# Patient Record
Sex: Male | Born: 1970 | Race: White | Hispanic: No | Marital: Single | Smoking: Former smoker
Health system: Southern US, Community
[De-identification: ages and names within clinical notes are randomized; demographics above are authoritative.]

## PROBLEM LIST (undated history)

## (undated) DIAGNOSIS — K5090 Crohn's disease, unspecified, without complications: Secondary | ICD-10-CM

## (undated) DIAGNOSIS — C801 Malignant (primary) neoplasm, unspecified: Secondary | ICD-10-CM

## (undated) HISTORY — PX: WISDOM TOOTH EXTRACTION: SHX21

## (undated) HISTORY — PX: COLONOSCOPY: SHX174

## (undated) HISTORY — DX: Malignant (primary) neoplasm, unspecified: C80.1

---

## 2001-05-07 ENCOUNTER — Ambulatory Visit (HOSPITAL_COMMUNITY): Admission: RE | Admit: 2001-05-07 | Discharge: 2001-05-07 | Payer: Self-pay | Admitting: Gastroenterology

## 2001-09-17 ENCOUNTER — Ambulatory Visit (HOSPITAL_COMMUNITY): Admission: RE | Admit: 2001-09-17 | Discharge: 2001-09-17 | Payer: Self-pay | Admitting: Gastroenterology

## 2001-09-17 ENCOUNTER — Encounter: Payer: Self-pay | Admitting: Gastroenterology

## 2010-12-12 ENCOUNTER — Encounter: Payer: Self-pay | Admitting: Gastroenterology

## 2012-01-26 HISTORY — PX: SHOULDER SURGERY: SHX246

## 2012-07-02 ENCOUNTER — Ambulatory Visit (INDEPENDENT_AMBULATORY_CARE_PROVIDER_SITE_OTHER): Payer: 59 | Admitting: Surgery

## 2012-07-02 ENCOUNTER — Encounter (INDEPENDENT_AMBULATORY_CARE_PROVIDER_SITE_OTHER): Payer: Self-pay | Admitting: Surgery

## 2012-07-02 VITALS — BP 120/82 | HR 82 | Temp 98.7°F | Ht 70.0 in | Wt 192.2 lb

## 2012-07-02 DIAGNOSIS — K644 Residual hemorrhoidal skin tags: Secondary | ICD-10-CM

## 2012-07-02 DIAGNOSIS — L0591 Pilonidal cyst without abscess: Secondary | ICD-10-CM

## 2012-07-02 DIAGNOSIS — L0592 Pilonidal sinus without abscess: Secondary | ICD-10-CM

## 2012-07-02 MED ORDER — BUPIVACAINE LIPOSOME 1.3 % IJ SUSP: 20.0000 mL | INTRAMUSCULAR | Status: AC

## 2012-07-02 NOTE — Patient Instructions (Addendum)
Pilonidal Cyst A pilonidal cyst occurs when hairs get trapped (ingrown) beneath the skin in the crease between the buttocks over your sacrum (the bone under that crease). Pilonidal cysts are most common in young men with a lot of body hair. When the cyst is ruptured (breaks) or leaking, fluid from the cyst may cause burning and itching. If the cyst becomes infected, it causes a painful swelling filled with pus (abscess). The pus and trapped hairs need to be removed (often by lancing) so that the infection can heal. However, recurrence is common and an operation may be needed to remove the cyst. HOME CARE INSTRUCTIONS   If the cyst was NOT INFECTED:   Keep the area clean and dry. Bathe or shower daily. Wash the area well with a germ-killing soap. Warm tub baths may help prevent infection and help with drainage. Dry the area well with a towel.   Avoid tight clothing to keep area as moisture free as possible.   Keep area between buttocks as free of hair as possible. A depilatory may be used.   If the cyst WAS INFECTED and needed to be drained:   Your caregiver packed the wound with gauze to keep the wound open. This allows the wound to heal from the inside outwards and continue draining.   Return for a wound check in 1 day or as suggested.   If you take tub baths or showers, repack the wound with gauze following them. Sponge baths (at the sink) are a good alternative.   If an antibiotic was ordered to fight the infection, take as directed.   Only take over-the-counter or prescription medicines for pain, discomfort, or fever as directed by your caregiver.   After the drain is removed, use sitz baths for 20 minutes 4 times per day. Clean the wound gently with mild unscented soap, pat dry, and then apply a dry dressing.  SEEK MEDICAL CARE IF:   You have increased pain, swelling, redness, drainage, or bleeding from the area.   You have a fever.   You have muscles aches, dizziness, or a  general ill feeling.  Document Released: 11/04/2000 Document Revised: 10/27/2011 Document Reviewed: 01/02/2009 [facility] Patient Information 2012 [place], [place].  HEMORRHOIDS   The rectum is the last few inches of your colon, and it naturally stretches to hold stool.  Hemorrhoidal piles are natural clusters of blood vessels that help the rectum stretch to hold stool and allow bowel movements to eliminate feces.  Hemorrhoids are abnormally swollen blood vessels in the rectum.  Too much pressure in the rectum causes hemorrhoids by forcing blood to stretch and bulge the walls of the veins, sometimes even rupturing them.  Hemorrhoids can become like varicose veins you might see on a person's legs. When bulging hemorrhoidal veins are irritated, they can swell, burn, itch, become very painful, and bleed. Once the rectal veins have been stretched out and hemorrhoids created, they are difficult to get rid of completely and tend to recur with less straining than it took to cause them in the first place. Fortunately, good habits and simple medical treatment usually control hemorrhoids well, and surgery is only recommended in unusually severe cases. Some of the most frequent causes of hemorrhoids:    Constant sitting    Straining with bowel movements (from constipation or hard stools)    Diarrhea    Sitting on the toilet for a long time    Severe coughing    Childbirth    Heavy Lifting  Types of Hemorrhoids:    Internal hemorrhoids usually don't hurt or itch; they are deep inside the rectum and usually have no sensation. However, internal hemorrhoids can bleed.  Such bleeding should not be ignored and mask blood from a dangerous source like colorectal cancer, so persistent rectal bleeding should be investigated with a colonoscopy.    External hemorrhoids cause most of the symptoms - pain, burning, and itching. Unirritated hemorrhoids can look like small skin tags coming out of the anus.     Thrombosed  hemorrhoids can form when a hemorrhoid blood vessel bursts and causes the hemorrhoid to swell.  A purple blood clot can form in it and become an excruciatingly painful lump at the anus. Because of these unpleasant symptoms, immediate incision and drainage by a surgeon at an office visit can provide much relief of the pain.    PREVENTION Avoiding the causes listed in above will prevent most cases of hemorrhoids, but this advice is sometimes hard to follow:  How can you avoid sitting all day if you have a seated job? Also, we try to avoid coughing and diarrhea, but sometimes it's beyond your control.  Still, there are some practical hints to help:    If your main job activity is seated, always stand or walk during your breaks. Make it a point to stand and walk at least 5 minutes every hour and try to shift frequently in your chair to avoid direct rectal pressure.    Always exhale as you strain or lift. Don't hold your breath.    Treat coughing, diarrhea and constipation early since irritated hemorrhoids may soon follow.    Do not delay or try to prevent a bowel movement when the urge is present.   Exercise regularly (walking or jogging 60 minutes a day) to stimulate the bowels to move.   Avoid dry toilet paper when cleaning after bowel movements.  Moistened tissues such as baby wipes are less irritating.  Lightly pat the rectal area dry.  Using irrigating showers or bottle irrigation washing can more gently clean this sensitive area.   Keep the anal and genital area clean and  dry.  Talcum or baby powders can help   GET YOUR STOOLS SOFT.   This is the most important way to prevent irritated hemorrhoids.  Hard stools are like sandpaper to the anorectal canal and will cause more problems.   The goal: ONE SOFT BOWEL MOVEMENT A DAY!  To have soft, regular bowel movements:    Drink at least 8 tall glasses of water a day.     AVOID CONSTIPATION    Take plenty of fiber.  Fiber is the undigested part of plant  food that passes into the colon, acting s "natures broom" to encourage bowel motility and movement.  Fiber can absorb and hold large amounts of water. This results in a larger, bulkier stool, which is soft and easier to pass. Work gradually over several weeks up to 6 servings a day of fiber (25g a day even more if needed) in the form of: o Vegetables -- Root (potatoes, carrots, turnips), leafy green (lettuce, salad greens, celery, spinach), or cooked high residue (cabbage, broccoli, etc) o Fruit -- Fresh (unpeeled skin & pulp), Dried (prunes, apricots, cherries, etc ),  or stewed ( applesauce)  o Whole grain breads, pasta, etc (whole wheat)  o Bran cereals    Bulking Agents -- This type of water-retaining fiber generally is easily obtained each day by one of the  following:  o Psyllium bran -- The psyllium plant is remarkable because its ground seeds can retain so much water. This product is available as Metamucil, Konsyl, Effersyllium, Per Diem Fiber, or the less expensive generic preparation in drug and health food stores. Although labeled a laxative, it really is not a laxative.  o Methylcellulose -- This is another fiber derived from wood which also retains water. It is available as Citrucel. o Polyethylene Glycol - and "artificial" fiber commonly called Miralax or Glycolax.  It is helpful for people with gassy or bloated feelings with regular fiber o Flax Seed - a less gassy fiber than psyllium   No reading or other relaxing activity while on the toilet. If bowel movements take longer than 5 minutes, you are too constipated   Laxatives can be useful for a short period if constipation is severe o Osmotics (Milk of Magnesia, Fleets phosphosoda, Magnesium citrate, MiraLax, GoLytely) are safer than  o Stimulants (Senokot, Castor Oil, Dulcolax, Ex Lax)    o Do not take laxatives for more than 7days in a row.   Laxatives are not a good long-term solution as it can stress the intestine and colon and  causes too much mineral and fluid losses.    If badly constipated, try a Bowel Retraining Program: o Do not use laxatives.  o Eat a diet high in roughage, such as bran cereals and leafy vegetables.  o Drink six (6) ounces of prune or apricot juice each morning.  o Eat two (2) large servings of stewed fruit each day.  o Take one (1) heaping dose of a bulking agent (ex. Metamucil, Citrucel, Miralax) twice a day.  o Use sugar-free sweetener when possible to avoid excessive calories.  o Eat a normal breakfast.  o Set aside 15 minutes after breakfast to sit on the toilet, but do not strain to have a bowel movement.  o If you do not have a bowel movement by the third day, use an enema and repeat the above steps.    AVOID DIARRHEA o Switch to liquids and simpler foods for a few days to avoid stressing your intestines further. o Avoid dairy products (especially milk & ice cream) for a short time.  The intestines often can lose the ability to digest lactose when stressed. o Avoid foods that cause gassiness or bloating.  Typical foods include beans and other legumes, cabbage, broccoli, and dairy foods.  Every person has some sensitivity to other foods, so listen to our body and avoid those foods that trigger problems for you. o Adding fiber (Citrucel, Metamucil, psyllium, Miralax) gradually can help thicken stools by absorbing excess fluid and retrain the intestines to act more normally.  Slowly increase the dose over a few weeks.  Too much fiber too soon can backfire and cause cramping & bloating. o Probiotics (such as active yogurt, Align, etc) may help repopulate the intestines and colon with normal bacteria and calm down a sensitive digestive tract.  Most studies show it to be of mild help, though, and such products can be costly. o Medicines:   Bismuth subsalicylate (ex. Kayopectate, Pepto Bismol) every 30 minutes for up to 6 doses can help control diarrhea.  Avoid if pregnant.   Loperamide (Immodium)  can slow down diarrhea.  Start with two tablets (4mg  total) first and then try one tablet every 6 hours.  Avoid if you are having fevers or severe pain.  If you are not better or start feeling worse, stop all medicines  and call your doctor for advice o Call your doctor if you are getting worse or not better.  Sometimes further testing (cultures, endoscopy, X-ray studies, bloodwork, etc) may be needed to help diagnose and treat the cause of the diarrhea.   If these preventive measures fail, you must take action right away! Hemorrhoids are one condition that can be mild in the morning and become intolerable by nightfall.

## 2012-07-02 NOTE — Progress Notes (Signed)
Subjective:     Patient ID: Carl Shields, male   DOB: Sep 09, 1971, 41 y.o.   MRN: [id number]  HPI   Review of Systems  Constitutional: Negative for fever, chills and diaphoresis.  HENT: Negative for nosebleeds, sore throat, facial swelling, mouth sores, trouble swallowing and ear discharge.   Eyes: Negative for photophobia, discharge and visual disturbance.  Respiratory: Negative for choking, chest tightness, shortness of breath and stridor.   Cardiovascular: Negative for chest pain and palpitations.  Gastrointestinal: Positive for constipation and rectal pain. Negative for nausea, vomiting, abdominal pain, diarrhea, blood in stool, abdominal distention and anal bleeding.  Genitourinary: Negative for dysuria, urgency, difficulty urinating and testicular pain.  Musculoskeletal: Negative for myalgias, back pain, arthralgias and gait problem.  Skin: Positive for wound. Negative for color change, pallor and rash.  Neurological: Negative for dizziness, speech difficulty, weakness, numbness and headaches.  Hematological: Negative for adenopathy. Does not bruise/bleed easily.  Psychiatric/Behavioral: Negative for hallucinations, confusion and agitation.       Objective:   Physical Exam  Constitutional: He is oriented to person, place, and time. He appears well-developed and well-nourished. No distress.  HENT:  Head: Normocephalic.  Mouth/Throat: Oropharynx is clear and moist. No oropharyngeal exudate.  Eyes: Conjunctivae and EOM are normal. Pupils are equal, round, and reactive to light. No scleral icterus.  Neck: Normal range of motion. Neck supple. No tracheal deviation present.  Cardiovascular: Normal rate, regular rhythm and intact distal pulses.   Pulmonary/Chest: Effort normal and breath sounds normal. No respiratory distress.  Abdominal: Soft. He exhibits no distension. There is no tenderness. Hernia confirmed negative in the right inguinal area and confirmed negative in the left  inguinal area.  Genitourinary: No penile tenderness.     Musculoskeletal: Normal range of motion. He exhibits no tenderness.       Back:  Lymphadenopathy:    He has no cervical adenopathy.       Right: No inguinal adenopathy present.       Left: No inguinal adenopathy present.  Neurological: He is alert and oriented to person, place, and time. No cranial nerve deficit. He exhibits normal muscle tone. Coordination normal.  Skin: Skin is warm and dry. No rash noted. He is not diaphoretic. No erythema. No pallor.  Psychiatric: He has a normal mood and affect. His behavior is normal. Judgment and thought content normal.       Assessment:     Pilonidal disease   Prolapsed hemorrhoid    Plan:     Removal & closure of pilonidal dz over penrose drains:  The anatomy of the intragluteal cleft was discussed. Pathophysiology of pilonidal disease was discussed. The importance of keeping hairs trimmed to avoid recurrence was discussed. Discussion of options such as curretage, excision with closure vs leaving open was discussed. Risks of infection and need for incision and drainage & antibiotics were discussed.  I noted a good likelihood this will help address the problem.    At this point, I think the patient would best served with considering surgery to excise the diseased tissue. I will make an attempt to close but it may need to be left open to allow it to heal with secondary intention and wound packing. Possible recurrences involve muscle flaps or different techniques were discussed as well. I noted that recurrence is higher with poor compliance on care maintenance and hygiene. The patient's questions were answered. The patient agrees to proceed.  Excision of ext hem:  The anatomy & physiology of  the anorectal region was discussed.  The pathophysiology of hemorrhoids and differential diagnosis was discussed.  Natural history risks without surgery was discussed.   I stressed the importance of  a bowel regimen to have daily soft bowel movements to minimize progression of disease.  Interventions such as sclerotherapy & banding were discussed.  The patient's symptoms are not adequately controlled by medicines and other non-operative treatments.  I feel the risks & problems of no surgery outweigh the operative risks; therefore, I recommended surgery to treat the hemorrhoids by ligation, pexy, and possible resection.  Risks such as bleeding, infection, need for further treatment, heart attack, death, and other risks were discussed.   I noted a good likelihood this will help address the problem.  Goals of post-operative recovery were discussed as well.  Possibility that this will not correct all symptoms was explained.  Post-operative pain, bleeding, constipation, and other problems after surgery were discussed.  We will work to minimize complications.   Educational handouts further explaining the pathology, treatment options, and bowel regimen were given as well.  Questions were answered.  The patient expresses understanding & wishes to proceed with surgery.

## 2012-11-06 ENCOUNTER — Other Ambulatory Visit (INDEPENDENT_AMBULATORY_CARE_PROVIDER_SITE_OTHER): Payer: Self-pay | Admitting: Surgery

## 2012-11-06 DIAGNOSIS — L0591 Pilonidal cyst without abscess: Secondary | ICD-10-CM

## 2012-11-06 DIAGNOSIS — K649 Unspecified hemorrhoids: Secondary | ICD-10-CM

## 2012-11-06 HISTORY — PX: PILONIDAL CYST EXCISION: SHX744

## 2012-11-22 ENCOUNTER — Telehealth (INDEPENDENT_AMBULATORY_CARE_PROVIDER_SITE_OTHER): Payer: Self-pay

## 2012-11-22 NOTE — Telephone Encounter (Signed)
The pt called in for his 1st refill postop.  He was on Oxycodone 5 mg but he wants something not as strong. Per protocol I called in Hydrocodone 5/325 one tab po q 4 hrs to 6 hrs prn pain #30 no refills to [facility] on [address] [phone].

## 2012-11-28 ENCOUNTER — Encounter (INDEPENDENT_AMBULATORY_CARE_PROVIDER_SITE_OTHER): Payer: Self-pay | Admitting: Surgery

## 2012-11-28 ENCOUNTER — Ambulatory Visit (INDEPENDENT_AMBULATORY_CARE_PROVIDER_SITE_OTHER): Payer: 59 | Admitting: Surgery

## 2012-11-28 VITALS — BP 112/82 | HR 78 | Temp 97.8°F | Resp 16 | Ht 70.0 in | Wt 198.2 lb

## 2012-11-28 DIAGNOSIS — L0592 Pilonidal sinus without abscess: Secondary | ICD-10-CM

## 2012-11-28 DIAGNOSIS — L0591 Pilonidal cyst without abscess: Secondary | ICD-10-CM

## 2012-11-28 DIAGNOSIS — K644 Residual hemorrhoidal skin tags: Secondary | ICD-10-CM

## 2012-11-28 NOTE — Progress Notes (Signed)
Subjective:     Patient ID: Carl Shields, male   DOB: 1971-07-09, 42 y.o.   MRN: [id number]  HPI  LEVII HAIRFIELD  11/30/1970 [id number]  Patient Care Team: Kandyce Rud, MD as PCP - General (Family Medicine)  This patient is a 42 y.o.male who presents today for surgical evaluation Status post excision of pilonidal disease with closure over wick drains and external hemorrhoidectomies 11/06/2012 FINAL DIAGNOSIS Diagnosis 1. Hemorrhoids, external - FINDINGS CONSISTENT WITH HEMORRHOIDS. 2. Pilonidal cyst/sinus - BENIGN SKIN WITH INFLAMMATION AND BENIGN SQUAMOUS CYST CONSISTENT WITH PILONIDAL CYST.  The patient comes in today feeling well.  Drainage is tapered off.  Soreness gone away.  Did not have much anal pain after the surgery which was happy surprise.  Rare to have any bleeding.  Off all pain medicines.  In good spirits.  Patient Active Problem List  Diagnosis  . 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 External hemorrhoids with prolapse    History reviewed. No pertinent past medical history.  Past Surgical History  Procedure Date  . Shoulder surgery 01/26/12    right  . Pilonidal cyst excision 11/06/12    History   Social History  . Marital Status: Single    Spouse Name: N/A    Number of Children: N/A  . Years of Education: N/A   Occupational History  . Not on file.   Social History Main Topics  . Smoking status: Former Games developer  . Smokeless tobacco: Former Neurosurgeon    Quit date: 07/02/2009  . Alcohol Use: Yes     Comment: 2 drinks per week  . Drug Use: No  . Sexually Active:    Other Topics Concern  . Not on file   Social History Narrative  . No narrative on file    History reviewed. No pertinent family history.  Current Outpatient Prescriptions  Medication Sig Dispense Refill  . acetaminophen (TYLENOL) 325 MG tablet Take 650 mg by mouth every 6 (six) hours as needed.      Marland Kitchen HYDROcodone-acetaminophen (NORCO/VICODIN) 5-325 MG per tablet Take 1 tablet by  mouth every 6 (six) hours as needed.      Marland Kitchen ibuprofen (ADVIL,MOTRIN) 200 MG tablet Take 200 mg by mouth every 6 (six) hours as needed.         Allergies  Allergen Reactions  . Contrast Media (Iodinated Diagnostic Agents) Itching    sneezing    BP 112/82  Pulse 78  Temp 97.8 F (36.6 C) (Temporal)  Resp 16  Ht 5\' 10"  (1.778 m)  Wt 198 lb 3.2 oz (89.903 kg)  BMI 28.44 kg/m2  SpO2 98%  No results found.   Review of Systems  Constitutional: Negative for fever, chills and diaphoresis.  HENT: Negative for sore throat, trouble swallowing and neck pain.   Eyes: Negative for photophobia and visual disturbance.  Respiratory: Negative for choking and shortness of breath.   Cardiovascular: Negative for chest pain and palpitations.  Gastrointestinal: Negative for nausea, vomiting, abdominal pain, diarrhea, constipation, blood in stool, abdominal distention, anal bleeding and rectal pain.  Genitourinary: Negative for dysuria, urgency, difficulty urinating and testicular pain.  Musculoskeletal: Negative for myalgias, arthralgias and gait problem.  Skin: Negative for color change and rash.  Neurological: Negative for dizziness, speech difficulty, weakness and numbness.  Hematological: Negative for adenopathy.  Psychiatric/Behavioral: Negative for hallucinations, confusion and agitation.       Objective:   Physical Exam  Constitutional: He is oriented to person, place, and time. He appears well-developed  and well-nourished. No distress.  HENT:  Head: Normocephalic.  Mouth/Throat: Oropharynx is clear and moist. No oropharyngeal exudate.  Eyes: Conjunctivae normal and EOM are normal. Pupils are equal, round, and reactive to light. No scleral icterus.  Neck: Normal range of motion. No tracheal deviation present.  Cardiovascular: Normal rate, normal heart sounds and intact distal pulses.   Pulmonary/Chest: Effort normal. No respiratory distress.  Abdominal: Soft. He exhibits no  distension. There is no tenderness. Hernia confirmed negative in the right inguinal area and confirmed negative in the left inguinal area.       Incisions clean with normal healing ridges.  No hernias  Musculoskeletal: Normal range of motion. He exhibits no tenderness.       Back:  Neurological: He is alert and oriented to person, place, and time. No cranial nerve deficit. He exhibits normal muscle tone. Coordination normal.  Skin: Skin is warm and dry. No rash noted. He is not diaphoretic.  Psychiatric: He has a normal mood and affect. His behavior is normal.       Assessment:     2.5 weeks status post excision of pilonidal disease and external hemorrhoids.  Recovering well.    Plan:     Increase activity as tolerated to regular activity.  Do not push through pain.  Diet as tolerated. Bowel regimen to avoid problems.  Return to clinic p.r.n.   Instructions discussed.  Followup with primary care physician for other health issues as would normally be done.  Questions answered.  The patient expressed understanding and appreciation  pos

## 2012-11-28 NOTE — Patient Instructions (Addendum)
Pilonidal Cyst  A pilonidal cyst occurs when hairs get trapped (ingrown) beneath the skin in the crease between the buttocks over your sacrum (the bone under that crease). Pilonidal cysts are most common in young men with a lot of body hair. When the cyst is ruptured (breaks) or leaking, fluid from the cyst may cause burning and itching. If the cyst becomes infected, it causes a painful swelling filled with pus (abscess). The pus and trapped hairs need to be removed (often by lancing) so that the infection can heal. However, recurrence is common and an operation may be needed to remove the cyst.  HOME CARE INSTRUCTIONS    If the cyst was NOT INFECTED:   Keep the area clean and dry. Bathe or shower daily. Wash the area well with a germ-killing soap. Warm tub baths may help prevent infection and help with drainage. Dry the area well with a towel.   Avoid tight clothing to keep area as moisture free as possible.   Keep area between buttocks as free of hair as possible. A depilatory may be used.   If the cyst WAS INFECTED and needed to be drained:   Your caregiver packed the wound with gauze to keep the wound open. This allows the wound to heal from the inside outwards and continue draining.   Return for a wound check in 1 day or as suggested.   If you take tub baths or showers, repack the wound with gauze following them. Sponge baths (at the sink) are a good alternative.   If an antibiotic was ordered to fight the infection, take as directed.   Only take over-the-counter or prescription medicines for pain, discomfort, or fever as directed by your caregiver.   After the drain is removed, use sitz baths for 20 minutes 4 times per day. Clean the wound gently with mild unscented soap, pat dry, and then apply a dry dressing.  SEEK MEDICAL CARE IF:    You have increased pain, swelling, redness, drainage, or bleeding from the area.   You have a fever.   You have muscles aches, dizziness, or a general ill  feeling.  Document Released: 11/04/2000 Document Revised: 01/30/2012 Document Reviewed: 01/02/2009  [employer] Patient Information 2013 [employer].

## 2017-03-10 ENCOUNTER — Other Ambulatory Visit: Payer: Self-pay | Admitting: Family Medicine

## 2017-03-10 DIAGNOSIS — R221 Localized swelling, mass and lump, neck: Secondary | ICD-10-CM

## 2017-03-10 DIAGNOSIS — Z87891 Personal history of nicotine dependence: Secondary | ICD-10-CM

## 2017-03-13 ENCOUNTER — Telehealth: Payer: Self-pay

## 2017-03-13 NOTE — Telephone Encounter (Signed)
LMOM for patient that his 13-hour prep was called in to [facility] on [address] [phone]).  He is to take Prednisone 50mg  PO 03/15/17 at 0400, 1000 and 1600.  At 1600 he also is to take Benadryl 50mg  PO.  Brita Romp, RN

## 2017-03-15 ENCOUNTER — Ambulatory Visit
Admission: RE | Admit: 2017-03-15 | Discharge: 2017-03-15 | Disposition: A | Discharge status: Home / Self Care | Payer: 59 | Source: Ambulatory Visit | Attending: Family Medicine | Admitting: Family Medicine

## 2017-03-15 DIAGNOSIS — R221 Localized swelling, mass and lump, neck: Secondary | ICD-10-CM

## 2017-03-15 DIAGNOSIS — Z87891 Personal history of nicotine dependence: Secondary | ICD-10-CM

## 2017-03-15 MED ORDER — IOPAMIDOL (ISOVUE-300) INJECTION 61%
75.0000 mL | Freq: Once | INTRAVENOUS | Status: AC | PRN
  Administered 2017-03-15: 75 mL via INTRAVENOUS

## 2017-04-01 ENCOUNTER — Telehealth: Payer: Self-pay | Admitting: Hematology and Oncology

## 2017-04-01 ENCOUNTER — Encounter: Payer: Self-pay | Admitting: Hematology and Oncology

## 2017-04-01 NOTE — Telephone Encounter (Signed)
Tc to schedule the pt an appt. Unable to reach Mr. Eline. Lft vm w/appt date and time. Appt has been scheduled for the pt to see Dr. Alvy Bimler on 5/23 at 1030am. Letter mailed to the pt and faxed to referring.

## 2017-04-07 ENCOUNTER — Telehealth: Payer: Self-pay | Admitting: *Deleted

## 2017-04-07 NOTE — Telephone Encounter (Addendum)
Oncology Nurse Navigator Documentation  Called [facility], spoke with [facility], requested appt with Dr. Erik Obey ASAP.  He will be contacted and offered appt for nest Monday.    Gayleen Orem, RN, BSN, [place] [facility] Nurse [place] at [place] [phone]

## 2017-04-10 ENCOUNTER — Telehealth: Payer: Self-pay | Admitting: *Deleted

## 2017-04-10 NOTE — Telephone Encounter (Signed)
Oncology Nurse Navigator Documentation  In follow-up to VM received from patient, called him/[facility] indicating this Wed's appt with Dr. Alvy Bimler has been cancelled, will be rescheduled when results of today's bx with Dr. Erik Obey are available.  I encouraged him to call with questions.  Gayleen Orem, RN, BSN, [place] [facility] Nurse [place] at [place] [phone]

## 2017-04-11 ENCOUNTER — Inpatient Hospital Stay (HOSPITAL_COMMUNITY): Admission: RE | Admit: 2017-04-11 | Payer: Self-pay | Source: Ambulatory Visit

## 2017-04-11 ENCOUNTER — Other Ambulatory Visit: Payer: Self-pay | Admitting: Radiation Oncology

## 2017-04-12 ENCOUNTER — Ambulatory Visit: Payer: 59 | Admitting: Hematology and Oncology

## 2017-04-18 ENCOUNTER — Telehealth: Payer: Self-pay | Admitting: *Deleted

## 2017-04-18 NOTE — Telephone Encounter (Signed)
Oncology Nurse Navigator Documentation  Returned Mr. Ricjmond's VMM. He reported result of bx "unfavorable", he will need PET. I explained I had just spoke to Dr. Nadara Mustard MA, Amy.  They are in process of have PET pre-certified, will schedule ASAP.  He voiced understanding a more timely PET can likely be scheduled if he is willing to travel to [facility].  He is agreeable as long as location is in network with his insurance.  I encouraged him to discuss with Dr. Noreene Filbert office when he is called to schedule. He understands I will arrange appt with Dr. Alvy Bimler when PET is scheduled.  Gayleen Orem, RN, BSN, [place] Neck Oncology Nurse [place] at [place] [phone]

## 2017-04-19 ENCOUNTER — Telehealth: Payer: Self-pay | Admitting: *Deleted

## 2017-04-19 ENCOUNTER — Encounter: Payer: Self-pay | Admitting: Hematology and Oncology

## 2017-04-19 ENCOUNTER — Other Ambulatory Visit (HOSPITAL_COMMUNITY): Payer: Self-pay | Admitting: Otolaryngology

## 2017-04-19 DIAGNOSIS — C770 Secondary and unspecified malignant neoplasm of lymph nodes of head, face and neck: Secondary | ICD-10-CM

## 2017-04-19 NOTE — Telephone Encounter (Signed)
Oncology Nurse Navigator Documentation  Patient returned my call.  I confirmed understanding of 6/6 PET, informed him of 6/8 0800 appt to see Dr. Isidore Moos preceded by 0730 NE.  I explained the purpose of a dental evaluation prior to starting RT, indicated he wd be contacted by [facility] to arrange appt.    He voiced understanding of information provided, understands he can contact me with needs/questions.  Gayleen Orem, RN, BSN, [place] Neck Oncology Nurse [place] at [place] [phone]

## 2017-04-25 ENCOUNTER — Encounter: Payer: Self-pay | Admitting: Radiation Oncology

## 2017-04-25 NOTE — Progress Notes (Addendum)
Head and Neck Cancer Location of Tumor / Histology:  Final Cytologic Interpretation  Right level 2 lymph node, fine needle aspiration l(smears, ThinPrep and cell block): Squamous cell carcinoma, metastatic  Patient presented with symptoms of: He noted the Right Neck mass in mid April.   Biopsies of Right level 2 lymph node revealed: metastatic squamous cell carcinoma.   Nutrition Status Yes No Comments  Weight changes? []  [x]    Swallowing concerns? []  [x]    PEG? []  [x]     Referrals Yes No Comments  Social Work? []  [x]    Dentistry? [x]  []  Dr. Enrique Sack 04/27/17  Swallowing therapy? []  [x]    Nutrition? []  [x]    Med/Onc? [x]  []     Safety Issues Yes No Comments  Prior radiation? []  [x]    Pacemaker/ICD? []  [x]    Possible current pregnancy? []  [x]    Is the patient on methotrexate? []  [x]     Tobacco/Marijuana/Snuff/ETOH use: He is a former smoker and former smokeless tobacco user. He tells me that he is not currently drinking alcohol.  Past/Anticipated interventions by otolaryngology, if any: 1/51/76 Dr. Erik Obey performed a Fine needle Aspiration.   Past/Anticipated interventions by medical oncology, if any:  Dr. Alvy Bimler 04/26/17 Tonsil cancer [facility]) The patient has clinical apparent early stage disease. He has excellent performance status. I think the patient may be a surgical candidate upfront. We will his ENT surgeon to ask whether primary tonsillectomy and selective right neck lymph node dissection versus TORS might be an option. If he has early stage disease with negative margins and no evidence of extracapsular nodal extension, he may be able to avoid chemotherapy altogether. I would get my head and neck navigator to call his ENT surgeon's office   Current Complaints / other details:   PET scan 04/26/17 IMPRESSION: 1. Enlarged hypermetabolic RIGHT level II lymph node consistent with metastatic head and neck carcinoma. 2. Asymmetric hypermetabolic activity in the RIGHT  tonsil is favored primary lesion. 3. No additional evidence metastatic adenopathy in the neck. 4. No distant metastatic disease  BP 118/77   Pulse 75   Temp 98.6 F (37 C)   Ht 5\' 10"  (1.778 m)   Wt 184 lb 12.8 oz (83.8 kg)   SpO2 99% Comment: room air  BMI 26.52 kg/m   Wt Readings from Last 3 Encounters:  04/28/17 184 lb 12.8 oz (83.8 kg)  04/26/17 187 lb 8 oz (85 kg)  11/28/12 198 lb 3.2 oz (89.9 kg)

## 2017-04-26 ENCOUNTER — Ambulatory Visit (HOSPITAL_BASED_OUTPATIENT_CLINIC_OR_DEPARTMENT_OTHER): Payer: 59 | Admitting: Hematology and Oncology

## 2017-04-26 ENCOUNTER — Encounter (HOSPITAL_COMMUNITY)
Admission: RE | Admit: 2017-04-26 | Discharge: 2017-04-26 | Disposition: A | Discharge status: Home / Self Care | Payer: 59 | Source: Ambulatory Visit | Attending: Otolaryngology | Admitting: Otolaryngology

## 2017-04-26 ENCOUNTER — Encounter: Payer: Self-pay | Admitting: Hematology and Oncology

## 2017-04-26 ENCOUNTER — Encounter: Payer: Self-pay | Admitting: *Deleted

## 2017-04-26 DIAGNOSIS — C770 Secondary and unspecified malignant neoplasm of lymph nodes of head, face and neck: Secondary | ICD-10-CM

## 2017-04-26 DIAGNOSIS — C099 Malignant neoplasm of tonsil, unspecified: Secondary | ICD-10-CM

## 2017-04-26 DIAGNOSIS — Z87891 Personal history of nicotine dependence: Secondary | ICD-10-CM

## 2017-04-26 DIAGNOSIS — C090 Malignant neoplasm of tonsillar fossa: Secondary | ICD-10-CM | POA: Insufficient documentation

## 2017-04-26 DIAGNOSIS — Z803 Family history of malignant neoplasm of breast: Secondary | ICD-10-CM

## 2017-04-26 DIAGNOSIS — Z808 Family history of malignant neoplasm of other organs or systems: Secondary | ICD-10-CM | POA: Diagnosis not present

## 2017-04-26 DIAGNOSIS — B977 Papillomavirus as the cause of diseases classified elsewhere: Secondary | ICD-10-CM

## 2017-04-26 DIAGNOSIS — Z801 Family history of malignant neoplasm of trachea, bronchus and lung: Secondary | ICD-10-CM

## 2017-04-26 LAB — GLUCOSE, CAPILLARY: Glucose-Capillary: 110 mg/dL — ABNORMAL HIGH (ref 65–99)

## 2017-04-26 MED ORDER — FLUDEOXYGLUCOSE F - 18 (FDG) INJECTION: 9.1000 | Freq: Once | INTRAVENOUS | Status: DC | PRN

## 2017-04-26 NOTE — Assessment & Plan Note (Addendum)
The patient has clinical apparent early stage disease. He has excellent performance status. I think the patient may be a surgical candidate upfront. We will his ENT surgeon to ask whether primary tonsillectomy and selective right neck lymph node dissection versus TORS might be an option. If he has early stage disease with negative margins and no evidence of extracapsular nodal extension, he may be able to avoid chemotherapy altogether. I would get my head and neck navigator to call his ENT surgeon's office. In the meantime, we will get his case presented at the next ENT tumor board and will arrange multidisciplinary clinic. If the patient declined surgery or is not a candidate for surgery, he would be a good candidate for concurrent chemoradiation treatment. In preparation for treatment, I will obtain dental clearance, multidisciplinary clinic to see dietitian, speech and language therapist and physical therapy

## 2017-04-26 NOTE — Progress Notes (Signed)
Oncology Nurse Navigator Documentation  Met with Mr. Wiedemann during initial consult with Dr. Alvy Bimler.  He was unaccompanied. 1. Further introduced myself as his Navigator, explained my role as a member of the Care Team. 2. Provided New Patient Information packet:  Contact information for physician, this navigator, other members of the Care Team  Advance Directive information ([place] blue pamphlet with LCSW insert), [facility] AD document.  SLP educational information  Fall Prevention Patient Safety Plan  Appointment Guideline  Financial Assistance Information sheet  [place] with highlight of [place] 3. He voiced understanding of PET results, expressed interest in surgical treatment option.  He understands I will contact ENT Dr. Noreene Filbert office with this request.   4. I discussed tentative attendance at the 6/26 [facility] [place]. 5. He verbalized understanding of information provided. I encouraged him to call with questions/concerns, he verbalized understanding.  Gayleen Orem, RN, BSN, [place] Neck Oncology Nurse [place] at [place] [phone]

## 2017-04-27 ENCOUNTER — Ambulatory Visit (HOSPITAL_COMMUNITY): Payer: Self-pay | Admitting: Dentistry

## 2017-04-27 ENCOUNTER — Encounter (HOSPITAL_COMMUNITY): Payer: Self-pay | Admitting: Dentistry

## 2017-04-27 ENCOUNTER — Encounter (INDEPENDENT_AMBULATORY_CARE_PROVIDER_SITE_OTHER): Payer: Self-pay

## 2017-04-27 VITALS — BP 129/79 | HR 72 | Temp 98.1°F

## 2017-04-27 DIAGNOSIS — K030 Excessive attrition of teeth: Secondary | ICD-10-CM

## 2017-04-27 DIAGNOSIS — K06010 Localized gingival recession, unspecified: Secondary | ICD-10-CM

## 2017-04-27 DIAGNOSIS — Z01818 Encounter for other preprocedural examination: Secondary | ICD-10-CM

## 2017-04-27 DIAGNOSIS — C099 Malignant neoplasm of tonsil, unspecified: Secondary | ICD-10-CM

## 2017-04-27 DIAGNOSIS — K031 Abrasion of teeth: Secondary | ICD-10-CM

## 2017-04-27 DIAGNOSIS — K08409 Partial loss of teeth, unspecified cause, unspecified class: Secondary | ICD-10-CM

## 2017-04-27 NOTE — Patient Instructions (Signed)

## 2017-04-27 NOTE — Progress Notes (Signed)
[place] NOTE  Patient Care Team: Via, Lennette Bihari, MD as PCP - General (Family Medicine) Jodi Marble, MD as Consulting Physician (Otolaryngology) Heath Lark, MD as Consulting Physician (Hematology and Oncology) Leota Sauers, RN as Oncology Nurse Navigator Eppie Gibson, MD as Attending Physician (Radiation Oncology) Karie Mainland, RD as Dietitian (Nutrition)  CHIEF COMPLAINTS/PURPOSE OF CONSULTATION:  Tonsil cancer with lymph node metastasis, for further management  HISTORY OF PRESENTING ILLNESS:  Carl Shields 46 y.o. male is here because of newly diagnosed right tonsil cancer According to the patient, the first initial presentation was due to palpable right neck swelling since middle of April 2018.  He has gotten a little worse since recent biopsy. he denies any hearing deficit, difficulties with chewing food, swallowing difficulties, painful swallowing, changes in the quality of his voice or abnormal weight loss. He saw his primary care doctor who ordered imaging study and ENT referral.  I review his records extensively and summarized as follows:   Tonsil cancer ([place])   03/15/2017 Imaging    CT neck 1. Heterogeneous enlarged right level 2 lymph node measures 3.8 x 2.7 x 1.8 cm. This is most concerning for a focal squamous cell metastasis. No definite primary lesion is seen. No other significant adenopathy is evident. 2. Mild fullness of the palatine tonsils bilaterally. Recommend direct inspection and biopsy as appropriate. 3. Mild spondylosis of the cervical spine.      04/11/2017 Pathology Results    Right level 2 lymph node, fine needle aspiration l (smears, ThinPrep and cell block): Squamous cell carcinoma, metastatic. Immunoperoxidase studies limited cell block material show scattered neoplastic cells with positive staining for p40 and p63 in a background of necrosis. Together with the cytomorphology, the findings are consistent with  metastatic squamous cell carcinoma. Addendum Diagnosis HPV testing is performed on block P18-8339 A1 (~60% tumor).  TestResults Reference Values   HPVHR type 16, PCRPOSITIVENegative  HPVHR type 18, PCRNegativeNegative  HPV other HR types, PCR NegativeNegative  (Negative for one of the other High Risk HPV types:31, 33, 35, 39, 45, 51, 52, 56, 58, 59, 66 and 68)      04/26/2017 PET scan    1. Enlarged hypermetabolic RIGHT level II lymph node consistent with metastatic head and neck carcinoma. 2. Asymmetric hypermetabolic activity in the RIGHT tonsil is favored primary lesion. 3. No additional evidence metastatic adenopathy in the neck. 4. No distant metastatic disease.      The patient have history of remote smoking but has quit many years ago. He has rare alcohol intake.  His Crohn's disease has not been active for over 10 years  MEDICAL HISTORY:  Past Medical History:  Diagnosis Date  . Cancer ([place])   . Crohn disease ([place])     SURGICAL HISTORY: Past Surgical History:  Procedure Laterality Date  . COLONOSCOPY    . PILONIDAL CYST EXCISION  11/06/12  . SHOULDER SURGERY  01/26/12   right  . WISDOM TOOTH EXTRACTION      SOCIAL HISTORY: Social History   Social History  . Marital status: Single    Spouse name: N/A  . Number of children: N/A  . Years of education: N/A   Occupational History  . manager    Social History Main Topics  . Smoking status: Former Smoker    Packs/day: 1.00    Years: 22.00    Quit date: 05/28/2009  . Smokeless tobacco: Former Systems developer    Quit date: 07/02/2009  . Alcohol use  Yes     Comment: 2 drinks per week  . Drug use: No  . Sexual activity: Not on file   Other Topics Concern  . Not on file   Social History Narrative  . No narrative on file    FAMILY HISTORY: Family History  Problem Relation Age of Onset  . Cancer Father         smoker, lung ca  . Cancer Maternal Aunt        breast ca  . Cancer Maternal Grandmother        brain cancer    ALLERGIES:  is allergic to contrast media [iodinated diagnostic agents].  MEDICATIONS:  Current Outpatient Prescriptions  Medication Sig Dispense Refill  . acetaminophen (TYLENOL) 325 MG tablet Take 650 mg by mouth every 6 (six) hours as needed.    Marland Kitchen ibuprofen (ADVIL,MOTRIN) 200 MG tablet Take 200 mg by mouth every 6 (six) hours as needed.    . zolpidem (AMBIEN) 5 MG tablet TK 1 T PO QD HS PRN  0   No current facility-administered medications for this visit.    Facility-Administered Medications Ordered in Other Visits  Medication Dose Route Frequency Provider Last Rate Last Dose  . fludeoxyglucose F - 18 (FDG) injection 9.1 millicurie  9.1 millicurie Intravenous Once PRN Abigail Miyamoto, MD        REVIEW OF SYSTEMS:   Constitutional: Denies fevers, chills or abnormal night sweats Eyes: Denies blurriness of vision, double vision or watery eyes Ears, nose, mouth, throat, and face: Denies mucositis or sore throat Respiratory: Denies cough, dyspnea or wheezes Cardiovascular: Denies palpitation, chest discomfort or lower extremity swelling Gastrointestinal:  Denies nausea, heartburn or change in bowel habits Skin: Denies abnormal skin rashes Neurological:Denies numbness, tingling or new weaknesses Behavioral/Psych: Mood is stable, no new changes  All other systems were reviewed with the patient and are negative.  PHYSICAL EXAMINATION: ECOG PERFORMANCE STATUS: 0 - Asymptomatic  Vitals:   04/26/17 1332  BP: 117/68  Pulse: 69  Resp: 18  Temp: 98.5 F (36.9 C)   Filed Weights   04/26/17 1332  Weight: 187 lb 8 oz (85 kg)    GENERAL:alert, no distress and comfortable SKIN: skin color, texture, turgor are normal, no rashes or significant lesions EYES: normal, conjunctiva are pink and non-injected, sclera clear OROPHARYNX:no exudate, no erythema and lips, buccal mucosa,  and tongue normal.  Noted fullness in the right tonsil NECK: supple, thyroid normal size, non-tender, without nodularity LYMPH: He has palpable lymphadenopathy in the right cervical region LUNGS: clear to auscultation and percussion with normal breathing effort HEART: regular rate & rhythm and no murmurs and no lower extremity edema ABDOMEN:abdomen soft, non-tender and normal bowel sounds Musculoskeletal:no cyanosis of digits and no clubbing  PSYCH: alert & oriented x 3 with fluent speech NEURO: no focal motor/sensory deficits   RADIOGRAPHIC STUDIES: I reviewed imaging study with the patient I have personally reviewed the radiological images as listed and agreed with the findings in the report. Nm Pet Image Initial (pi) Skull Base To Thigh  Result Date: 04/26/2017 CLINICAL DATA:  Initial treatment strategy for swollen lymph nodes. EXAM: NUCLEAR MEDICINE PET SKULL BASE TO THIGH TECHNIQUE: 9.1 mCi F-18 FDG was injected intravenously. Full-ring PET imaging was performed from the skull base to thigh after the radiotracer. CT data was obtained and used for attenuation correction and anatomic localization. FASTING BLOOD GLUCOSE:  Value: 110 mg/dl COMPARISON:  None. FINDINGS: NECK Enlarge RIGHT level II lymph node has intense metabolic  activity with SUV equal 7.2. Node measures 1.8 cm short axis. There is metabolic activity within LEFT and RIGHT palatini tonsil however greater on the RIGHT with SUV equal 9.5 on the RIGHT compared to 5.0 on the LEFT. There is mild thickening of the RIGHT palatini tonsil. No additional hypermetabolic cervical lymph nodes. No supraclavicular cervical. CHEST No hypermetabolic mediastinal or hilar nodes. No suspicious pulmonary nodules on the CT scan. ABDOMEN/PELVIS No abnormal hypermetabolic activity within the liver, pancreas, adrenal glands, or spleen. No hypermetabolic lymph nodes in the abdomen or pelvis. SKELETON No focal hypermetabolic activity to suggest skeletal  metastasis. IMPRESSION: 1. Enlarged hypermetabolic RIGHT level II lymph node consistent with metastatic head and neck carcinoma. 2. Asymmetric hypermetabolic activity in the RIGHT tonsil is favored primary lesion. 3. No additional evidence metastatic adenopathy in the neck. 4. No distant metastatic disease. Electronically Signed   By: Suzy Bouchard M.D.   On: 04/26/2017 09:52    ASSESSMENT:  Newly diagnosed squamous cell carcinoma of the Head & Neck, HPV Positive  PLAN:  Tonsil cancer ([place]) The patient has clinical apparent early stage disease. He has excellent performance status. I think the patient may be a surgical candidate upfront. We will his ENT surgeon to ask whether primary tonsillectomy and selective right neck lymph node dissection versus TORS might be an option. If he has early stage disease with negative margins and no evidence of extracapsular nodal extension, he may be able to avoid chemotherapy altogether. I would get my head and neck navigator to call his ENT surgeon's office. In the meantime, we will get his case presented at the next ENT tumor board and will arrange multidisciplinary clinic. If the patient declined surgery or is not a candidate for surgery, he would be a good candidate for concurrent chemoradiation treatment. In preparation for treatment, I will obtain dental clearance, multidisciplinary clinic to see dietitian, speech and language therapist and physical therapy  Orders Placed This Encounter  Procedures  . Ambulatory Referral to Speech Therapy  (specifically to Garald Balding)    Referral Priority:   Routine    Referral Type:   Speech Therapy    Referral Reason:   Specialty Services Required    Requested Specialty:   Speech Pathology    Number of Visits Requested:   1  . Ambulatory Referral to Dentistry (specifically to Dr. Enrique Sack)    Referral Priority:   Routine    Referral Type:   Consultation    Referral Reason:   Specialty Services Required     Requested Specialty:   Dental General Practice    Number of Visits Requested:   1  . Amb Referral to Nutrition and Diabetic Education (specifically to Ernestene Kiel)    Referral Priority:   Routine    Referral Type:   Consultation    Referral Reason:   Specialty Services Required    Number of Visits Requested:   1  . Ambulatory Referral to Social Work    Referral Priority:   Routine    Referral Type:   Consultation    Referral Reason:   Specialty Services Required    Number of Visits Requested:   1  . Ambulatory Referral to Physical Therapy    Referral Priority:   Routine    Referral Type:   Physical Medicine    Referral Reason:   Specialty Services Required    Requested Specialty:   Physical Therapy    Number of Visits Requested:   1  All questions were answered. The patient knows to call the clinic with any problems, questions or concerns. I spent 40 minutes counseling the patient face to face. The total time spent in the appointment was 60 minutes and more than 50% was on counseling.     Heath Lark, MD 04/27/17 9:38 AM

## 2017-04-27 NOTE — Progress Notes (Signed)
DENTAL CONSULTATION  Date of Consultation:  04/27/2017 Patient Name:   Carl Shields Date of Birth:   1971-08-12 Medical Record Number: [id number]  VITALS: BP 129/79 (BP Location: Left Arm)   Pulse 72   Temp 98.1 F (36.7 C) (Oral)   CHIEF COMPLAINT: Patient referred by Dr. Alvy Bimler for a dental consultation.  HPI: Carl Shields is a 46 year old male recently diagnosed with right tonsillar cancer. Patient with anticipated chemoradiation therapy but may choose TORS procedure at [facility] followed by postoperative radiation therapy and possible postoperative chemotherapy. Patient is now seen as part of a medically necessary pre-chemoradiation therapy dental protocol examination.  Patient currently denies acute toothaches, swellings, or abscesses. Patient was last saw a Dentist in April 2018 for an exam and cleaning with [facility] in [place]. Patient is usually seen on an every 6 month basis. Patient has been seeing [facility] for approximately 6 years. Patient has a history of intermittent temporal mandibular joint problems and currently denies acute problems. Patient denies ever having had occlusal splint therapy. Patient did have previous orthodontic therapy in his late teens. Patient also had his wisdom teeth removed in his teens. Patient denies having dental phobia.  PROBLEM LIST: Patient Active Problem List   Diagnosis Date Noted  . Tonsil cancer ([place]) 04/26/2017    Priority: High  . Pilonidal Sinues without Abscess 07/02/2012  . External hemorrhoids with prolapse 07/02/2012    PMH: Past Medical History:  Diagnosis Date  . Cancer ([place])   . Crohn disease ([place])     PSH: Past Surgical History:  Procedure Laterality Date  . COLONOSCOPY    . PILONIDAL CYST EXCISION  11/06/12  . SHOULDER SURGERY  01/26/12   right  . WISDOM TOOTH EXTRACTION      ALLERGIES: Allergies  Allergen Reactions  . Contrast Media [Iodinated Diagnostic Agents] Itching and Other (See  Comments)    sneezing    MEDICATIONS: Current Outpatient Prescriptions  Medication Sig Dispense Refill  . acetaminophen (TYLENOL) 325 MG tablet Take 650 mg by mouth every 6 (six) hours as needed.    Marland Kitchen ibuprofen (ADVIL,MOTRIN) 200 MG tablet Take 200 mg by mouth every 6 (six) hours as needed.    . zolpidem (AMBIEN) 5 MG tablet TK 1 T PO QD HS PRN  0   No current facility-administered medications for this visit.    Facility-Administered Medications Ordered in Other Visits  Medication Dose Route Frequency Provider Last Rate Last Dose  . fludeoxyglucose F - 18 (FDG) injection 9.1 millicurie  9.1 millicurie Intravenous Once PRN Abigail Miyamoto, MD        LABS: No results found for: WBC, HGB, HCT, MCV, PLT No results found for: NA, K, CL, CO2, GLUCOSE, BUN, CREATININE, CALCIUM, GFRNONAA, GFRAA No results found for: INR, PROTIME No results found for: PTT  SOCIAL HISTORY: Social History   Social History  . Marital status: Single    Spouse name: N/A  . Number of children: 0  . Years of education: N/A   Occupational History  . Investment banker, operational   Social History Main Topics  . Smoking status: Former Smoker    Packs/day: 1.00    Years: 22.00    Quit date: 05/28/2009  . Smokeless tobacco: Never Used  . Alcohol use Yes     Comment: 2 drinks per week-rare use now  . Drug use: No  . Sexual activity: Not on file   Other Topics Concern  .  Not on file   Social History Narrative  . No narrative on file    FAMILY HISTORY: Family History  Problem Relation Age of Onset  . Cancer Father        smoker, lung ca  . Cancer Maternal Aunt        breast ca  . Cancer Maternal Grandmother        brain cancer    REVIEW OF SYSTEMS: Reviewed with the patient as per History of present illness. Psych: Patient denies having dental phobia.  DENTAL HISTORY: CHIEF COMPLAINT: Patient referred by Dr. Alvy Bimler for a dental consultation.  HPI: Carl Shields is a  46 year old male recently diagnosed with right tonsillar cancer. Patient with anticipated chemoradiation therapy but may choose TORS procedure at [facility] followed by postoperative radiation therapy and possible postoperative chemotherapy. Patient is now seen as part of a medically necessary pre-chemoradiation therapy dental protocol examination.  Patient currently denies acute toothaches, swellings, or abscesses. Patient was last saw a Dentist in April 2018 for an exam and cleaning with [facility] in [place]. Patient is usually seen on an every 6 month basis. Patient has been seeing [facility] for approximately 6 years. Patient has a history of intermittent temporal mandibular joint problems and currently denies acute problems. Patient denies ever having had occlusal splint therapy. Patient did have previous orthodontic therapy in his late teens. Patient also had his wisdom teeth removed in his teens. Patient denies having dental phobia.   DENTAL EXAMINATION: GENERAL: Patient is a well-developed, well-nourished male in no acute distress. HEAD AND NECK: Patient has right neck lymphadenopathy. I do not palpate any left neck lymphadenopathy. The patient has right TMJ click/ pop on maximum opening. Patient denies acute TMJ symptoms, however. INTRAORAL EXAM: Patient has normal saliva. There is no evidence of oral abscess formation. DENTITION: Patient is missing wisdom tooth numbers 1, 16, 17, and 32. The patient has maxillary and mandibular anterior incisal attrition. Multiple flexure lesions are noted. PERIODONTAL: Patient has good oral hygiene with pockets less than or equal to 3 mm. Patient has localized areas of gingival recession. No tooth mobility is noted. DENTAL CARIES/SUBOPTIMAL RESTORATIONS: No dental caries are noted. Multiple flexure lesions are noted as per dental charting form. ENDODONTIC: Patient denies acute pulpitis symptoms. I do not see any evidence of periapical  pathology. CROWN AND BRIDGE: There are no crown or bridge restorations. PROSTHODONTIC: There are no partial dentures. OCCLUSION: Patient has a stable occlusion but evidence of maxillary and mandibular interincisal attrition. Patient may benefit from occlusal splint therapy as indicated. Patient also may benefit from future evaluation of temporomandibular joint dysfunction as indicated.  RADIOGRAPHIC INTERPRETATION: Orthopantogram was reviewed from Dr. Windy Fast office. A full series of dental radiographs was obtained today. Patient is missing tooth numbers 1, 16, 17 and 32. There is evidence of maxillary and mandibular interincisal attrition. There is incipient bone loss noted. No obvious periapical radiolucencies are noted. No obvious dental caries are noted. Bilateral maxillary sinuses are noted and appear to be well aerated.  ASSESSMENTS: 1. Tonsillar cancer. 2. Pre-chemoradiation therapy dental protocol examination 3. Maxillary and mandibular anterior incisal attrition 4. Multiple flexure lesions 5. Missing wisdom tooth numbers 1, 16, 17 and 32. 6. Right temporomandibular joint click/pop on maximum opening-no acute symptoms 7. Status post orthodontic therapy 8. Stable occlusion  PLAN/RECOMMENDATIONS: 1. I discussed the risks, benefits, and complications of various treatment options with the patient in relationship to his medical and dental conditions, anticipated chemoradiation therapy, and  chemoradiation therapy side effects to include xerostomia, radiation caries, trismus, mucositis, taste changes, gum and jawbone changes, and risk for infection and osteoradionecrosis. We discussed various treatment options to include no treatment, consideration for extraction of tooth numbers 2 and 31 if the teeth are in the primary field of radiation therapy, alveoloplasty, pre-prosthetic surgery as indicated, periodontal therapy, dental restorations, root canal therapy, crown and bridge therapy, implant  therapy, occlusal splint therapy, and replacement of missing teeth as indicated. We also discussed impressions today for the fabrication of future fluoride trays. The patient currently wishes to proceed with consultation with Dr. Isidore Moos concerning radiation therapy recommendations. Patient is leaning towards the surgical consultation concerning TORS procedure at [facility] followed by radiation therapy and possible chemotherapy. Patient will then consider extraction of indicated teeth based on the anticipated doses and ports for the future radiation therapy. Patient did agree to proceed with impressions today for future fabrication of fluoride trays. A prescription for fluoride will be dispensed at that time once fluoride trays are inserted. There is no need for scatter protection devices as the patient has no restorations or metal fillings in his mouth.   2. Discussion of findings with medical team and coordination of future medical and dental care as needed.  I spent in excess of  90 minutes during the conduct of this consultation and >50% of this time involved direct face-to-face encounter for counseling and/or coordination of the patient's care.    Lenn Cal, DDS

## 2017-04-28 ENCOUNTER — Ambulatory Visit
Admission: RE | Admit: 2017-04-28 | Discharge: 2017-04-28 | Disposition: A | Discharge status: Home / Self Care | Payer: 59 | Source: Ambulatory Visit | Attending: Radiation Oncology | Admitting: Radiation Oncology

## 2017-04-28 ENCOUNTER — Encounter: Payer: Self-pay | Admitting: Radiation Oncology

## 2017-04-28 ENCOUNTER — Encounter: Payer: Self-pay | Admitting: *Deleted

## 2017-04-28 DIAGNOSIS — Z79899 Other long term (current) drug therapy: Secondary | ICD-10-CM | POA: Insufficient documentation

## 2017-04-28 DIAGNOSIS — C099 Malignant neoplasm of tonsil, unspecified: Secondary | ICD-10-CM

## 2017-04-28 DIAGNOSIS — C090 Malignant neoplasm of tonsillar fossa: Secondary | ICD-10-CM | POA: Diagnosis not present

## 2017-04-28 HISTORY — DX: Crohn's disease, unspecified, without complications: K50.90

## 2017-04-28 NOTE — Progress Notes (Signed)
Oncology Nurse Navigator Documentation  Met with patient during initial consult with Dr. Isidore Moos.  He was unaccompanied.    1. He had met with Dr. Alvy Bimler, MedOnc, on Wed, had dental eval with Dr. Enrique Sack yesterday. 2. He reported Dr. Enrique Sack suggested 2 R molars may need extraction if RT treatment.   3. Provided introductory explanation of radiation treatment including SIM planning and purpose of Aquaplast head and shoulder mask, showed them example.   4. Dr. Isidore Moos to refer him to [facility] for discussion of TORS. 5. He understands to contact me questions/concerns as he decides treatment option.  Gayleen Orem, RN, BSN, [place] [facility] Oncology Nurse [place] [facility] [place] [phone]

## 2017-04-28 NOTE — Progress Notes (Signed)
Radiation Oncology         [phone] ________________________________  Initial Outpatient Consultation  Name: Carl Shields MRN: [id number]  Date: 04/28/2017  DOB: 1970/12/01  CC:Via, Lennette Bihari, MD  Jodi Marble, MD   REFERRING PHYSICIAN: Jodi Marble, MD  DIAGNOSIS:  Cancer of the Tonsillar Fossa, C09.0  Cancer Staging Cancer of tonsillar fossa [facility]) Staging form: Pharynx - HPV-Mediated Oropharynx, AJCC 8th Edition - Clinical: Stage I (cT1, cN1, cM0, p16: Positive) - Signed by Heath Lark, MD on 04/26/2017  CHIEF COMPLAINT: Here to discuss management of tonsil cancer  HISTORY OF PRESENT ILLNESS::Carl Shields is a 46 y.o. male who noticed a right neck mass 3 months ago that did not resolve on its own. He had a CT of the neck on 03/15/17 that showed an enlarged right level II lymph node measuring 3.8 x 2.7 x 1.8 cm concerning for a focal squamous cell metastasis and mild fullness of the palatine tonsils bilaterally. The patient presented to Dr. Erik Obey on 6/38/46 and physical exam at the time noted the "Oropharynx shows 1+ residual tonsils with slight irregularity and firmness of the right tonsil... 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 Patient reports that the neck mass shrank after needle aspiration; "pus-like material was removed."  PET scan on 04/26/17 showed a 1.8 cm right level II lymph node with SUV max 7.2, metabolic activity in the left and right palatini tonsil with SUV max 9.5 on the right and 5.0 on the left with mild thickening of the right palatini tonsil, favoring right tonsil primary, and no additional hypermetabolic cervical lymph nodes. No distant metastases.  The patient saw Dr. Alvy Bimler on 04/26/17 who believes the patient might be a surgical candidate upfront with primary tonsillectomy and selective  right neck lymph node dissection vs TORS. If he has early stage disease with negative margins and no evidence of extracapsular nodal extension, he may be able to avoid chemotherapy altogether. She wrote she will get her head and neck navigator to call the patient's ENT surgeon's office. In the meantime, Dr. Alvy Bimler would like to get his case presented at the next ENT tumor board and will arrange multidisciplinary clinic. If the patient declined surgery or is not a candidate for surgery, she believed he would be a good candidate for concurrent chemoradiation treatment. In preparation for treatment, she would obtain dental clearance, multidisciplinary clinic to see dietitian, speech and language therapist, and physical therapy.  The patient saw Dr. Enrique Sack on 04/27/17 who discussed no dental treatment vs extraction of tooth numbers 2 and 31 if the teeth are in the primary field of radiation therapy. The patient presents today to discuss radiation for the management of his disease. Gayleen Orem, RN, our Head and Neck Oncology Navigator was present during this encounter.  Swallowing issues, if any: Denies  Weight Changes: Denies weight changes.  Pain status: Left jaw pain, but right TMJ according to Dr. Enrique Sack, chronic off/on headaches from working on a computer all day.  Other symptoms: Denies pain throughout the rest of his body. The patient has Crohn's disease managed with Tums and diet.  Tobacco history, if any: Smoking, 15-20 pack year history. Stopped 8 years ago in 2018.  ETOH abuse, if any: Not currently drinking alcohol.  Prior cancers, if any: None  PREVIOUS RADIATION THERAPY: No  PAST MEDICAL HISTORY:  has a past medical history of Cancer ([place]) and Crohn disease ([place]).    PAST SURGICAL HISTORY: Past Surgical History:  Procedure Laterality Date  . COLONOSCOPY    . PILONIDAL CYST EXCISION  11/06/12  . SHOULDER SURGERY  01/26/12   right  . WISDOM TOOTH EXTRACTION      FAMILY HISTORY:  family history includes Brain cancer in his maternal grandmother; Breast cancer in his maternal aunt; Lung cancer in his father.  SOCIAL HISTORY:  reports that he quit smoking about 7 years ago. He has a 20.00 pack-year smoking history. He has never used smokeless tobacco. He reports that he drinks alcohol. He reports that he does not use drugs.  ALLERGIES: Contrast media [iodinated diagnostic agents]  MEDICATIONS:  Current Outpatient Prescriptions  Medication Sig Dispense Refill  . acetaminophen (TYLENOL) 325 MG tablet Take 650 mg by mouth every 6 (six) hours as needed.    Marland Kitchen ibuprofen (ADVIL,MOTRIN) 200 MG tablet Take 200 mg by mouth every 6 (six) hours as needed.    . zolpidem (AMBIEN) 5 MG tablet TK 1 T PO QD HS PRN  0   No current facility-administered medications for this encounter.    Facility-Administered Medications Ordered in Other Encounters  Medication Dose Route Frequency Provider Last Rate Last Dose  . fludeoxyglucose F - 18 (FDG) injection 9.1 millicurie  9.1 millicurie Intravenous Once PRN Abigail Miyamoto, MD        REVIEW OF SYSTEMS:  A 10+ POINT REVIEW OF SYSTEMS WAS OBTAINED including neurology, dermatology, psychiatry, cardiac, respiratory, lymph, extremities, GI, GU, Musculoskeletal, constitutional, breasts, reproductive, HEENT.  All pertinent positives are noted in the HPI.  All others are negative.   PHYSICAL EXAM:  height is 5\' 10"  (1.778 m) and weight is 184 lb 12.8 oz (83.8 kg). His temperature is 98.6 F (37 C). His blood pressure is 118/77 and his pulse is 75. His oxygen saturation is 99%.   General: Alert and oriented, in no acute distress HEENT: Head is normocephalic. Extraocular movements are intact. Oropharynx is notable for a dominant nodule arising over the right tonsil which is about 1 cm in greatest dimension, there's no ulceration and no fungation. Neck: Neck is notable for a right level II mass about 2 - 2.5 cm in greatest dimension. No other masses  appreciated in the bilateral cervical or supraclavicular regions. Heart: Regular in rate and rhythm with no murmurs, rubs, or gallops. Chest: Clear to auscultation bilaterally, with no rhonchi, wheezes, or rales. Abdomen: Soft, nontender, nondistended, with no rigidity or guarding. Extremities: No cyanosis or edema. Lymphatics: see Neck Exam Skin: No concerning lesions. Musculoskeletal: symmetric strength and muscle tone throughout. Neurologic: Cranial nerves II through XII are grossly intact. No obvious focalities. Speech is fluent. Coordination is intact. Psychiatric: Judgment and insight are intact. Affect is appropriate.   ECOG = 0  0 - Asymptomatic (Fully active, able to carry on all predisease activities without restriction)  1 - Symptomatic but completely ambulatory (Restricted in physically strenuous activity but ambulatory and able to carry out work of a light or sedentary nature. For example, light housework, office work)  2 - Symptomatic, <50% in bed during the day (Ambulatory and capable of all self care but unable to carry out any work activities. Up and about more than 50% of waking hours)  3 - Symptomatic, >50% in bed, but not bedbound (Capable of only limited self-care, confined to bed or chair 50% or more of waking hours)  4 - Bedbound (Completely  disabled. Cannot carry on any self-care. Totally confined to bed or chair)  5 - Death   Eustace Pen MM, Creech RH, Tormey DC, et al. [phone]). "Toxicity and response criteria of the [facility]". [place] Oncol. 5 (6): 649-55   LABORATORY DATA:  No results found for: WBC, HGB, HCT, MCV, PLT CMP  No results found for: NA, K, CL, CO2, GLUCOSE, BUN, CREATININE, CALCIUM, PROT, ALBUMIN, AST, ALT, ALKPHOS, BILITOT, GFRNONAA, GFRAA       RADIOGRAPHY: Nm Pet Image Initial (pi) Skull Base To Thigh  Result Date: 04/26/2017 CLINICAL DATA:  Initial treatment strategy for swollen lymph nodes. EXAM: NUCLEAR MEDICINE  PET SKULL BASE TO THIGH TECHNIQUE: 9.1 mCi F-18 FDG was injected intravenously. Full-ring PET imaging was performed from the skull base to thigh after the radiotracer. CT data was obtained and used for attenuation correction and anatomic localization. FASTING BLOOD GLUCOSE:  Value: 110 mg/dl COMPARISON:  None. FINDINGS: NECK Enlarge RIGHT level II lymph node has intense metabolic activity with SUV equal 7.2. Node measures 1.8 cm short axis. There is metabolic activity within LEFT and RIGHT palatini tonsil however greater on the RIGHT with SUV equal 9.5 on the RIGHT compared to 5.0 on the LEFT. There is mild thickening of the RIGHT palatini tonsil. No additional hypermetabolic cervical lymph nodes. No supraclavicular cervical. CHEST No hypermetabolic mediastinal or hilar nodes. No suspicious pulmonary nodules on the CT scan. ABDOMEN/PELVIS No abnormal hypermetabolic activity within the liver, pancreas, adrenal glands, or spleen. No hypermetabolic lymph nodes in the abdomen or pelvis. SKELETON No focal hypermetabolic activity to suggest skeletal metastasis. IMPRESSION: 1. Enlarged hypermetabolic RIGHT level II lymph node consistent with metastatic head and neck carcinoma. 2. Asymmetric hypermetabolic activity in the RIGHT tonsil is favored primary lesion. 3. No additional evidence metastatic adenopathy in the neck. 4. No distant metastatic disease. Electronically Signed   By: Suzy Bouchard M.D.   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 I reviewed the NCCN guidelines with the patient. His disease is relatively early stage and HPV positive. However, his lymph node before biopsy was 3.8 cm ; according to NCCN guidelines this still warrants concurrent chemoradiation as standard of care for cure or TORS as upfront therapy. If his lymph node had been less than 3 cm, definitive RT  could have been considered per national standards. We can discuss this more at tumor board.  We discussed potential adverse features which may warrant adjuvant radiotherapy if he proceeds with TORS. He does understand there is a smaller chance that he could also need adjuvant chemotherapy following TORS. At this time, the patient is enthusiastic to discuss TORS with a specialist at [facility]. I will make a referral to prevent delays. We will hold off on referrals to supportive specialists at multidisciplinary clinic until we determine if he receives adjuvant radiation or if he chooses ChRT instead of TORS.  We discussed the potential risks, benefits, and side effects of radiotherapy. We talked in detail about acute and late effects. We discussed that some of the most bothersome acute effects may be mucositis, dysgeusia, salivary changes, skin irritation, hair loss, dehydration, weight loss and fatigue. We talked about late effects which include but are not necessarily limited to dysphagia, hypothyroidism, nerve injury, spinal cord injury, xerostomia, trismus, and neck edema. No guarantees of treatment were given.  A consent form was signed and placed in the patient's medical record in case he does need to proceed with radiation.  I will also estimate dose clouds for Dr. Enrique Sack and send those estimates to him today, it is possible the patient may not need extractions given the expected distribution of radiation.  We also discussed that the treatment of head and neck cancer is a multidisciplinary process and his case will be discussed at tumor board.  We will hold off on more scheduling until his disposition is determined at [facility] re: TORS. __________________________________________   Eppie Gibson, MD   This document serves as a record of services personally performed by Eppie Gibson, MD. It was created on her behalf by Darcus Austin, a trained medical scribe. The creation of this record is based on  the scribe's personal observations and the provider's statements to them. This document has been checked and approved by the attending provider.

## 2017-05-03 ENCOUNTER — Telehealth: Payer: Self-pay | Admitting: *Deleted

## 2017-05-03 ENCOUNTER — Other Ambulatory Visit: Payer: Self-pay | Admitting: Radiation Oncology

## 2017-05-03 DIAGNOSIS — C090 Malignant neoplasm of tonsillar fossa: Secondary | ICD-10-CM

## 2017-05-03 NOTE — Telephone Encounter (Signed)
CALLED PATIENT TO INFORM OF APPT. WITH DR. Francina Ames ON 05-16-17 - ARRIVAL TIME - 10:15 AM , ADDRESS- [address], [place], [place]. NO. [phone], SPOKE WITH PATIENT AND HE IS AWARE OF THIS APPT.

## 2017-05-10 ENCOUNTER — Ambulatory Visit: Payer: 59 | Attending: Hematology and Oncology | Admitting: Physical Therapy

## 2017-05-10 DIAGNOSIS — R293 Abnormal posture: Secondary | ICD-10-CM | POA: Diagnosis present

## 2017-05-10 DIAGNOSIS — C099 Malignant neoplasm of tonsil, unspecified: Secondary | ICD-10-CM | POA: Insufficient documentation

## 2017-05-10 NOTE — Therapy (Signed)
[place], [place], [address] Phone: [phone]   Fax:  [phone]  Physical Therapy Evaluation  Patient Details  Name: Carl Shields MRN: [id number] Date of Birth: 04-Jan-1971 Referring Provider: Dr. Heath Lark  Encounter Date: 05/10/2017      PT End of Session - 05/10/17 1651    Visit Number 1   Number of Visits 1   PT Start Time 3818   PT Stop Time 1645   PT Time Calculation (min) 40 min   Activity Tolerance Patient tolerated treatment well   Behavior During Therapy [facility] for tasks assessed/performed      Past Medical History:  Diagnosis Date  . Cancer ([place])   . Crohn disease [facility])     Past Surgical History:  Procedure Laterality Date  . COLONOSCOPY    . PILONIDAL CYST EXCISION  11/06/12  . SHOULDER SURGERY  01/26/12   right  . WISDOM TOOTH EXTRACTION      There were no vitals filed for this visit.       Subjective Assessment - 05/10/17 1607    Subjective "I have cancer.  I do understand that the radiation could cause a swollen neck." Will have a consult about possible robotic surgery at [facility].   Pertinent History Presented with swelling in neck in April.  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 PET: Enlarged hypermetabolic RIGHT level II lymph node consistent with metastatic head and neck carcinoma. Hypermetabolic tonsil thought to be primary site. h/o right rotator cuff and labrum tear with surgical repair, 2013.  Good result with that. Cyst removed in right low back, also in 2013.    Patient Stated Goals get info from all clinic providers   Currently in Pain? No/denies            [facility] PT Assessment - 05/10/17 0001      Assessment   Medical Diagnosis tonsil cancer, squamous cell   Referring Provider Dr. Heath Lark   Hand Dominance Right     Precautions   Precautions Other (comment)   Precaution Comments cancer precautions      Restrictions   Weight Bearing Restrictions No     Balance Screen   Has the patient fallen in the past 6 months No   Has the patient had a decrease in activity level because of a fear of falling?  No   Is the patient reluctant to leave their home because of a fear of falling?  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   Leisure walks 5 miles a day, 6 days a week and lifts weights     Cognition   Overall Cognitive Status Within Functional Limits for tasks assessed     Observation/Other Assessments   Observations mild swelling visible at right side of neck in this otherwise healthy looking man     Functional Tests   Functional tests Sit to Stand     Sit to Stand   Comments 22 times in 30 seconds  mild dyspnea following     Posture/Postural Control   Posture/Postural Control Postural limitations   Postural Limitations Forward  head     ROM / Strength   AROM / PROM / Strength AROM     AROM   Overall AROM Comments neck and both shoulders show good A/ROM     Palpation   Palpation comment mild visible and palpable swelling at right neck     Ambulation/Gait   Ambulation/Gait Yes   Ambulation/Gait Assistance 7: Independent           LYMPHEDEMA/ONCOLOGY QUESTIONNAIRE - 05/10/17 1624      Type   Cancer Type tonsil     Lymphedema Assessments   Lymphedema Assessments Head and Neck     Head and Neck   4 cm superior to sternal notch around neck 39.6 cm   6 cm superior to sternal notch around neck 39.5 cm   8 cm superior to sternal notch around neck 39.8 cm   Other at 10 cm. superior, 40 cm.         Objective measurements completed on examination: See above findings.                   PT Education - 05/10/17 1651    Education provided Yes   Education Details neck ROM, posture, breathing, walking, CURE article on staying active, lymphedema and PT info   Person(s) Educated Patient   Methods Explanation;Handout   Comprehension Verbalized understanding                 Head and Neck Clinic Goals - 05/10/17 1655      Patient will be able to verbalize understanding of a home exercise program for cervical range of motion, posture, and walking.    Status Achieved     Patient will be able to verbalize understanding of proper sitting and standing posture.    Status Achieved     Patient will be able to verbalize understanding of lymphedema risk and availability of treatment for this condition.    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  He will have a consult for possible robotic surgery, and will have radiation with chemo possible.  He currently has mild visible and palpable swelling of the neck but no other limitations. He does have forward head posture.   History and Personal Factors relevant to plan of care: h/o right rotator cuff and shoulder labrum tear surgery   Clinical Presentation Evolving   Clinical Decision Making Low   Rehab Potential Excellent   PT Frequency One time visit   PT Treatment/Interventions Patient/family education   PT Next Visit Plan None at this time; he may need therapy should lymphedema develop   PT Home Exercise Plan continue walking, do neck ROM   Consulted and Agree with Plan of Care Patient      Patient will benefit from skilled therapeutic intervention in order to improve the following deficits and impairments:  Increased edema, Postural dysfunction  Visit Diagnosis: Malignant neoplasm of tonsil ([place]) - Plan: PT  plan of care cert/re-cert  Abnormal posture - Plan: PT plan of care cert/re-cert     Problem List Patient Active Problem List   Diagnosis Date Noted  . Cancer of tonsillar fossa ([place]) 04/26/2017  . Pilonidal Sinues without Abscess 07/02/2012  . External hemorrhoids with prolapse 07/02/2012    SALISBURY,DONNA 05/10/2017, 4:57 PM  [place]  [place], [place], [address] Phone: [phone]   Fax:  [phone]  Name: Carl Shields MRN: [id number] Date of Birth: 01-27-1971  Serafina Royals, PT 05/10/17 4:58 PM

## 2017-06-23 ENCOUNTER — Telehealth: Payer: Self-pay | Admitting: *Deleted

## 2017-06-23 NOTE — Telephone Encounter (Signed)
Oncology Nurse Navigator Documentation  Called patient to check on well-being since 7/16 TORS with Dr. Vicie Mutters, [facility].  LVMM with request for call-back.  Gayleen Orem, RN, BSN, [place] [facility] Oncology Nurse [place] at [place] [phone]

## 2017-06-27 ENCOUNTER — Telehealth: Payer: Self-pay | Admitting: *Deleted

## 2017-06-27 NOTE — Telephone Encounter (Signed)
Oncology Nurse Navigator Documentation  Called Mr. Maya in follow-up to my call/VMM 8/3 re planned post-surgical tmt if any s/p 7/16 TORS at [facility] requesting call-back/update.  Gayleen Orem, RN, BSN, [place] [facility] Oncology Nurse [place] at [place] [phone]

## 2018-05-27 IMAGING — PT NM PET TUM IMG INITIAL (PI) SKULL BASE T - THIGH
1 of 8 series · 1 of 25 positions shown · non-contrast
Comparison: None.

CLINICAL DATA: Initial treatment strategy for swollen lymph nodes..

EXAM:
NUCLEAR MEDICINE PET SKULL BASE TO THIGH
TECHNIQUE: 9.1 mCi F-18 FDG was injected intravenously. Full-ring PET imaging
was performed from the skull base to thigh after the radiotracer. CT
data was obtained and used for attenuation correction and anatomic
localization.
FASTING BLOOD GLUCOSE:  Value: 110 mg/dl

[Series 4: ct hn_sk_th 5.0 b31f · axial · 5.0mm · 0.98mm/px · 1 of 232 slices shown]
[im 232/232  brain]
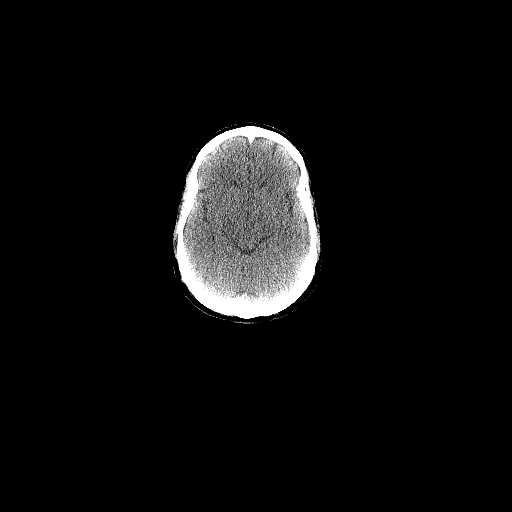

[1 of 25 positions shown; findings below may reference images not displayed]

FINDINGS: NECK

Enlarge RIGHT level II lymph node has intense metabolic activity
with SUV equal 7.2. Node measures 1.8 cm short axis.

There is metabolic activity within LEFT and RIGHT palatini tonsil
however greater on the RIGHT with SUV equal 9.5 on the RIGHT
compared to 5.0 on the LEFT. There is mild thickening of the RIGHT
palatini tonsil.

No additional hypermetabolic cervical lymph nodes. No
supraclavicular cervical.

CHEST

No hypermetabolic mediastinal or hilar nodes. No suspicious
pulmonary nodules on the CT scan.

ABDOMEN/PELVIS

No abnormal hypermetabolic activity within the liver, pancreas,
adrenal glands, or spleen. No hypermetabolic lymph nodes in the
abdomen or pelvis.

SKELETON

No focal hypermetabolic activity to suggest skeletal metastasis.
IMPRESSION: 1. Enlarged hypermetabolic RIGHT level II lymph node consistent with
metastatic head and neck carcinoma.
2. Asymmetric hypermetabolic activity in the RIGHT tonsil is favored
primary lesion.
3. No additional evidence metastatic adenopathy in the neck.
4. No distant metastatic disease.
